# Patient Record
Sex: Male | Born: 1970 | Race: Black or African American | Hispanic: No | Marital: Married | Smoking: Never smoker
Health system: Southern US, Community
[De-identification: ages and names within clinical notes are randomized; demographics above are authoritative.]

## PROBLEM LIST (undated history)

## (undated) DIAGNOSIS — I4891 Unspecified atrial fibrillation: Secondary | ICD-10-CM

## (undated) DIAGNOSIS — R5383 Other fatigue: Secondary | ICD-10-CM

## (undated) DIAGNOSIS — I480 Paroxysmal atrial fibrillation: Secondary | ICD-10-CM

## (undated) DIAGNOSIS — E7800 Pure hypercholesterolemia, unspecified: Secondary | ICD-10-CM

## (undated) DIAGNOSIS — R079 Chest pain, unspecified: Secondary | ICD-10-CM

## (undated) DIAGNOSIS — R0602 Shortness of breath: Secondary | ICD-10-CM

## (undated) HISTORY — DX: Chest pain, unspecified: R07.9

## (undated) HISTORY — DX: Other fatigue: R53.83

## (undated) HISTORY — DX: Pure hypercholesterolemia, unspecified: E78.00

## (undated) HISTORY — DX: Paroxysmal atrial fibrillation: I48.0

## (undated) HISTORY — DX: Shortness of breath: R06.02

---

## 2012-03-07 ENCOUNTER — Encounter (HOSPITAL_BASED_OUTPATIENT_CLINIC_OR_DEPARTMENT_OTHER): Payer: Self-pay | Admitting: *Deleted

## 2012-03-07 ENCOUNTER — Emergency Department (HOSPITAL_BASED_OUTPATIENT_CLINIC_OR_DEPARTMENT_OTHER): Payer: 59

## 2012-03-07 ENCOUNTER — Emergency Department (HOSPITAL_BASED_OUTPATIENT_CLINIC_OR_DEPARTMENT_OTHER)
Admission: EM | Admit: 2012-03-07 | Discharge: 2012-03-07 | Disposition: A | Discharge status: Home / Self Care | Payer: 59 | Attending: Emergency Medicine | Admitting: Emergency Medicine

## 2012-03-07 DIAGNOSIS — Z7982 Long term (current) use of aspirin: Secondary | ICD-10-CM | POA: Insufficient documentation

## 2012-03-07 DIAGNOSIS — I4891 Unspecified atrial fibrillation: Secondary | ICD-10-CM | POA: Insufficient documentation

## 2012-03-07 DIAGNOSIS — R0602 Shortness of breath: Secondary | ICD-10-CM | POA: Insufficient documentation

## 2012-03-07 HISTORY — DX: Unspecified atrial fibrillation: I48.91

## 2012-03-07 LAB — DIFFERENTIAL
Lymphocytes Relative: 41 % (ref 12–46)
Lymphs Abs: 1.9 10*3/uL (ref 0.7–4.0)
Monocytes Absolute: 0.4 10*3/uL (ref 0.1–1.0)
Monocytes Relative: 9 % (ref 3–12)
Neutro Abs: 2.1 10*3/uL (ref 1.7–7.7)

## 2012-03-07 LAB — COMPREHENSIVE METABOLIC PANEL
BUN: 18 mg/dL (ref 6–23)
CO2: 27 mEq/L (ref 19–32)
Chloride: 103 mEq/L (ref 96–112)
Creatinine, Ser: 1.1 mg/dL (ref 0.50–1.35)
GFR calc non Af Amer: 82 mL/min — ABNORMAL LOW (ref >90)
Glucose, Bld: 94 mg/dL (ref 70–99)
Total Bilirubin: 0.6 mg/dL (ref 0.3–1.2)

## 2012-03-07 LAB — TROPONIN I: Troponin I: <0.3 ng/mL (ref <0.30)

## 2012-03-07 LAB — CBC
HCT: 46 % (ref 39.0–52.0)
Hemoglobin: 16.9 g/dL (ref 13.0–17.0)
MCHC: 36.7 g/dL — ABNORMAL HIGH (ref 30.0–36.0)
RBC: 5.44 MIL/uL (ref 4.22–5.81)

## 2012-03-07 LAB — PROTIME-INR: Prothrombin Time: 13 seconds (ref 11.6–15.2)

## 2012-03-07 MED ORDER — DILTIAZEM HCL 100 MG IV SOLR
5.0000 mg/h | INTRAVENOUS | Status: DC
  Administered 2012-03-07: 5 mg/h via INTRAVENOUS

## 2012-03-07 MED ORDER — DILTIAZEM HCL 100 MG IV SOLR
INTRAVENOUS | Status: AC
  Filled 2012-03-07: qty 100

## 2012-03-07 MED ORDER — DILTIAZEM HCL 50 MG/10ML IV SOLN
10.0000 mg | Freq: Once | INTRAVENOUS | Status: AC
  Administered 2012-03-07: 25 mg via INTRAVENOUS
  Filled 2012-03-07: qty 2

## 2012-03-07 MED ORDER — DILTIAZEM HCL ER COATED BEADS 240 MG PO CP24: 240.0000 mg | ORAL_CAPSULE | Freq: Every day | ORAL | Status: DC

## 2012-03-07 MED ORDER — DILTIAZEM HCL 25 MG/5ML IV SOLN
INTRAVENOUS | Status: AC
  Filled 2012-03-07: qty 5

## 2012-03-07 MED ORDER — DILTIAZEM HCL ER COATED BEADS 120 MG PO CP24
240.0000 mg | ORAL_CAPSULE | Freq: Once | ORAL | Status: AC
  Administered 2012-03-07: 240 mg via ORAL
  Filled 2012-03-07: qty 2

## 2012-03-07 MED ORDER — PROCAINAMIDE HCL 100 MG/ML IJ SOLN
1000.0000 mg | Freq: Once | INTRAVENOUS | Status: AC
  Administered 2012-03-07: 1000 mg via INTRAVENOUS
  Filled 2012-03-07: qty 1

## 2012-03-07 MED ORDER — RIVAROXABAN 20 MG PO TABS: 20.0000 mg | ORAL_TABLET | ORAL | Status: DC

## 2012-03-07 NOTE — ED Notes (Signed)
Shortness of breath started last night states that he has been having some shortness of breath when walking for a while but last night the shortness of breath became worse when he lay down. Denies cough , fever, or chills, denies chest pain states last night thought it might be indigestion but this am has continued. Reports 14 yrs ago was told he had a fib and was given atenolol and told to take an aspirin a day. Has been taking a baby aspirin a day for the last month denies chest pain or radiation denies any swelling of extremeties

## 2012-03-07 NOTE — Discharge Instructions (Signed)
Atrial Fibrillation Your caregiver has diagnosed you with atrial fibrillation (AFib). The heart normally beats very regularly; AFib is a type of irregular heartbeat. The heart rate may be faster or slower than normal. This can prevent your heart from pumping as well as it should. AFib can be constant (chronic) or intermittent (paroxysmal). CAUSES  Atrial fibrillation may be caused by:  Heart disease, including heart attack, coronary artery disease, heart failure, diseases of the heart valves, and others.   Blood clot in the lungs (pulmonary embolism).   Pneumonia or other infections.   Chronic lung disease.   Thyroid disease.   Toxins. These include alcohol, some medications (such as decongestant medications or diet pills), and caffeine.  In some people, no cause for AFib can be found. This is referred to as Lone Atrial Fibrillation. SYMPTOMS   Palpitations or a fluttering in your chest.   A vague sense of chest discomfort.   Shortness of breath.   Sudden onset of lightheadedness or weakness.  Sometimes, the first sign of AFib can be a complication of the condition. This could be a stroke or heart failure. DIAGNOSIS  Your description of your condition may make your caregiver suspicious of atrial fibrillation. Your caregiver will examine your pulse to determine if fibrillation is present. An EKG (electrocardiogram) will confirm the diagnosis. Further testing may help determine what caused you to have atrial fibrillation. This may include chest x-Everest Hacking, echocardiogram, blood tests, or CT scans. PREVENTION  If you have previously had atrial fibrillation, your caregiver may advise you to avoid substances known to cause the condition (such as stimulant medications, and possibly caffeine or alcohol). You may be advised to use medications to prevent recurrence. Proper treatment of any underlying condition is important to help prevent recurrence. PROGNOSIS  Atrial fibrillation does tend to  become a chronic condition over time. It can cause significant complications (see below). Atrial fibrillation is not usually immediately life-threatening, but it can shorten your life expectancy. This seems to be worse in women. If you have lone atrial fibrillation and are under 60 years old, the risk of complications is very low, and life expectancy is not shortened. RISKS AND COMPLICATIONS  Complications of atrial fibrillation can include stroke, chest pain, and heart failure. Your caregiver will recommend treatments for the atrial fibrillation, as well as for any underlying conditions, to help minimize risk of complications. TREATMENT  Treatment for AFib is divided into several categories:  Treatment of any underlying condition.   Converting you out of AFib into a regular (sinus) rhythm.   Controlling rapid heart rate.   Prevention of blood clots and stroke.  Medications and procedures are available to convert your atrial fibrillation to sinus rhythm. However, recent studies have shown that this may not offer you any advantage, and cardiac experts are continuing research and debate on this topic. More important is controlling your rapid heartbeat. The rapid heartbeat causes more symptoms, and places strain on your heart. Your caregiver will advise you on the use of medications that can control your heart rate. Atrial fibrillation is a strong stroke risk. You can lessen this risk by taking blood thinning medications such as Coumadin (warfarin), or sometimes aspirin. These medications need close monitoring by your caregiver. Over-medication can cause bleeding. Too little medication may not protect against stroke. HOME CARE INSTRUCTIONS   If your caregiver prescribed medicine to make your heartbeat more normally, take as directed.   If blood thinners were prescribed by your caregiver, take EXACTLY as directed.     Perform blood tests EXACTLY as directed.   Quit smoking. Smoking increases your  cardiac and lung (pulmonary) risks.   DO NOT drink alcohol.   DO NOT drink caffeinated drinks (e.g. coffee, soda, chocolate, and leaf teas). You may drink decaffeinated coffee, soda or tea.   If you are overweight, you should choose a reduced calorie diet to lose weight. Please see a registered dietitian if you need more information about healthy weight loss. DO NOT USE DIET PILLS as they may aggravate heart problems.   If you have other heart problems that are causing AFib, you may need to eat a low salt, fat, and cholesterol diet. Your caregiver will tell you if this is necessary.   Exercise every day to improve your physical fitness. Stay active unless advised otherwise.   If your caregiver has given you a follow-up appointment, it is very important to keep that appointment. Not keeping the appointment could result in heart failure or stroke. If there is any problem keeping the appointment, you must call back to this facility for assistance.  SEEK MEDICAL CARE IF:  You notice a change in the rate, rhythm or strength of your heartbeat.   You develop an infection or any other change in your overall health status.  SEEK IMMEDIATE MEDICAL CARE IF:   You develop chest pain, abdominal pain, sweating, weakness or feel sick to your stomach (nausea).   You develop shortness of breath.   You develop swollen feet and ankles.   You develop dizziness, numbness, or weakness of your face or limbs, or any change in vision or speech.  MAKE SURE YOU:   Understand these instructions.   Will watch your condition.   Will get help right away if you are not doing well or get worse.  Document Released: 09/11/2005 Document Revised: 08/31/2011 Document Reviewed: 04/15/2008 [facility] Patient Information 2012 [place], [place].   Return if any chest pain.  Follow up tomorrow with cardiology as scheduled.

## 2012-03-07 NOTE — ED Provider Notes (Signed)
History     CSN: [id number]  Arrival date & time 03/07/12  1478   First MD Initiated Contact with Patient 03/07/12 [phone]      Chief Complaint  Patient presents with  . Shortness of Breath    (Consider location/radiation/quality/duration/timing/severity/associated sxs/prior treatment) HPI  Past Medical History  Diagnosis Date  . Atrial fibrillation    Patient sent here from urgent care with report that he is in new-onset atrial fibrillation. Patient states that he began having some palpitations and shortness of breath last night during the night. He denies any chest pain. He had a similar episode 14 years ago that resolved spontaneously. He denies any pain associated with this. He denies cough fever or chills. He has been taking baby aspirin every day but no other medications. He denies any lightheadedness. History reviewed. No pertinent past surgical history.  History reviewed. No pertinent family history.  History  Substance Use Topics  . Smoking status: Not on file  . Smokeless tobacco: Not on file  . Alcohol Use: No      Review of Systems  All other systems reviewed and are negative.    Allergies  Review of patient's allergies indicates no known allergies.  Home Medications   Current Outpatient Rx  Name Route Sig Dispense Refill  . ONE-DAILY MULTI VITAMINS PO TABS Oral Take 1 tablet by mouth daily.    . ASPIRIN 81 MG PO TABS Oral Take 81 mg by mouth daily. Took too in the morning      BP 128/71  Pulse 108  Resp 20  Ht 6' (1.829 m)  Wt 197 lb (89.359 kg)  BMI 26.72 kg/m2  SpO2 99%  Physical Exam  Nursing note and vitals reviewed. Constitutional: He is oriented to person, place, and time. He appears well-developed and well-nourished.  HENT:  Head: Normocephalic and atraumatic.  Right Ear: External ear normal.  Left Ear: External ear normal.  Nose: Nose normal.  Mouth/Throat: Oropharynx is clear and moist.  Eyes: Conjunctivae and EOM are normal.  Pupils are equal, round, and reactive to light.  Neck: Normal range of motion. Neck supple.  Cardiovascular: Normal heart sounds and intact distal pulses.  An irregularly irregular rhythm present. Tachycardia present.   Pulmonary/Chest: Effort normal and breath sounds normal.  Abdominal: Soft. Bowel sounds are normal.  Musculoskeletal: Normal range of motion.  Neurological: He is alert and oriented to person, place, and time. He has normal reflexes.  Skin: Skin is warm and dry.  Psychiatric: He has a normal mood and affect. His behavior is normal. Thought content normal.    ED Course  Procedures (including critical care time)  Labs Reviewed  CBC - Abnormal; Notable for the following:    MCHC 36.7 (*)     All other components within normal limits  COMPREHENSIVE METABOLIC PANEL - Abnormal; Notable for the following:    Alkaline Phosphatase 26 (*)     GFR calc non Af Amer 82 (*)     All other components within normal limits  DIFFERENTIAL  TROPONIN I  PROTIME-INR  APTT   Dg Chest Port 1 View  03/07/2012  *RADIOLOGY REPORT*  Clinical Data: Shortness of breath.  New onset of atrial fibrillation.  PORTABLE CHEST - 1 VIEW  Comparison: No priors.  Findings: Lung volumes are normal.  No consolidative airspace disease.  No pleural effusions.  No pneumothorax.  No pulmonary nodule or mass noted.  Pulmonary vasculature and the cardiomediastinal silhouette are within normal limits.  There  is expansion and lucency in the posterior aspect of the left fifth rib.  The remaining bones of the thorax are otherwise unremarkable.  IMPRESSION: 1.  No radiographic evidence of acute cardiopulmonary disease. 2.  Expansion and lucency in the posterior aspect of the left fifth rib.  While this could conceivably be a benign finding that is congenital or related to prior trauma, the possibility of an aggressive lesion such as a metastatic lesion is not excluded. Clinical correlation is recommended.  If there are any  outside prior chest radiographs for comparison would be extremely useful. In the absence of prior studies, and in the absence of a known congenital anomaly or a known history of trauma that could explain this finding, further evaluation with a contrast-enhanced CT scan of the thorax would be useful.  Original Report Authenticated By: Florencia Reasons, M.D.     No diagnosis found.   Date: 03/07/2012  Rate: 110  Rhythm: atrial fibrillation  QRS Axis: normal  Intervals: normal  ST/T Wave abnormalities: nonspecific ST changes  Conduction Disutrbances:none  Narrative Interpretation:   Old EKG Reviewed: none available   Date: 03/07/2012  Rate: 107  Rhythm:atrial fibrillation  QRS Axis: normal  Intervals: normal  ST/T Wave abnormalities: nonspecific ST changes  Conduction Disutrbances:none  Narrative Interpretation:   Old EKG Reviewed: unchanged    MDM  Patient given procainamide 1 g over one hour. Patient did not have any change in the tracing with this. Patient then received Cardizem 10 mg at with a drip at 5 mg per hour followed by a 20 mg bolus. Heart rate is currently 90 with blood pressure 110/80. Patient's care discussed with Dr. Shirlee Latch he asked that patient be started on diltiazem 250 mg extended release and  xarelto 20 mg/day.  And he states that he will make an appointment for patient to be seen tomorrow and feels that he is stable for discharge given normal labs and stable hemodynamic status. Patient scheduled for follow up tomorrow at 2 p.m.        Hilario Quarry, MD 03/07/12 1500

## 2012-03-08 ENCOUNTER — Encounter: Payer: Self-pay | Admitting: Cardiology

## 2012-03-08 ENCOUNTER — Ambulatory Visit (INDEPENDENT_AMBULATORY_CARE_PROVIDER_SITE_OTHER): Payer: 59 | Admitting: Cardiology

## 2012-03-08 VITALS — BP 128/71 | HR 80 | Ht 72.0 in | Wt 197.0 lb

## 2012-03-08 DIAGNOSIS — I4891 Unspecified atrial fibrillation: Secondary | ICD-10-CM

## 2012-03-08 DIAGNOSIS — I480 Paroxysmal atrial fibrillation: Secondary | ICD-10-CM

## 2012-03-08 DIAGNOSIS — R0989 Other specified symptoms and signs involving the circulatory and respiratory systems: Secondary | ICD-10-CM

## 2012-03-08 DIAGNOSIS — R0600 Dyspnea, unspecified: Secondary | ICD-10-CM

## 2012-03-08 MED ORDER — DILTIAZEM HCL ER COATED BEADS 240 MG PO CP24: 240.0000 mg | ORAL_CAPSULE | Freq: Every day | ORAL | Status: DC

## 2012-03-08 MED ORDER — RIVAROXABAN 20 MG PO TABS: 20.0000 mg | ORAL_TABLET | Freq: Every day | ORAL | Status: DC

## 2012-03-08 NOTE — Patient Instructions (Addendum)
Rx's for Xarelto and Cartia have been sent to your pharmacy  Stop your ASA after two days of Xarelto  Your physician has requested that you have an echocardiogram. Echocardiography is a painless test that uses sound waves to create images of your heart. It provides your doctor with information about the size and shape of your heart and how well your heart's chambers and valves are working. This procedure takes approximately one hour. There are no restrictions for this procedure.   Your physician has requested that you have en exercise stress myoview. For further information please visit [email or web address] Please follow instruction sheet, as given. FASTING LABS DAY OF STRESS TEST   Your physician recommends that you schedule a follow-up appointment in: 1 month  USE ONLY TYLENOL FOR FEVER/PAIN----NO ADVIL/IBUPROFEN

## 2012-03-08 NOTE — Progress Notes (Signed)
Reason for Consult: Atrial fibrillation Referring Physician: [facility] [place] [facility] emergency room  HENCE Colton Hodges is an 41 y.o. male.  HPI: This pleasant 41 year old African American gentleman is seen for evaluation of arrhythmia.  He was seen yesterday afternoon at [facility] [place] complaining of shortness of breath and an irregular pulse.  He was found to be in atrial fibrillation.  This rhythm had occurred once previously about 20 years ago but had not recurred.  In the emergency room he was evaluated and was encouraged to be admitted for further observation but the patient declined admission but did state that he would come to our office today for followup.  In the emergency room his atrial fibrillation was documented at a rate of 110.  Emergency room he was initially treated with 1 g of procainamide over an hour but there was no change in this tracing.  He then was placed on a Cardizem 10 mg drip but remained in atrial flutter.  His care was discussed with the on-call cardiologist who recommended that the patient be started on Xarelto and diltiazem extended release.  After the patient got home last night he felt that his heart had gone back into normal rhythm.  Past Medical History  Diagnosis Date  . Atrial fibrillation   . Atrial fibrillation     No past surgical history on file.  No family history on file.  Social History:  reports that he has never smoked. He does not have any smokeless tobacco history on file. He reports that he does not drink alcohol or use illicit drugs.  Allergies: No Known Allergies  Medications: I have reviewed the patient's current medications.  Results for orders placed during the hospital encounter of 03/07/12 (from the past 48 hour(s))  CBC     Status: Abnormal   Collection Time   03/07/12  9:38 AM      Component Value Range Comment   WBC 4.6  4.0 - 10.5 K/uL    RBC 5.44  4.22 - 5.81 MIL/uL    Hemoglobin 16.9  13.0 - 17.0 g/dL    HCT 16.1   09.6 - 04.5 %    MCV 84.6  78.0 - 100.0 fL    MCH 31.1  26.0 - 34.0 pg    MCHC 36.7 (*) 30.0 - 36.0 g/dL    RDW 40.9  81.1 - 91.4 %    Platelets 195  150 - 400 K/uL   DIFFERENTIAL     Status: Normal   Collection Time   03/07/12  9:38 AM      Component Value Range Comment   Neutrophils Relative 45  43 - 77 %    Neutro Abs 2.1  1.7 - 7.7 K/uL    Lymphocytes Relative 41  12 - 46 %    Lymphs Abs 1.9  0.7 - 4.0 K/uL    Monocytes Relative 9  3 - 12 %    Monocytes Absolute 0.4  0.1 - 1.0 K/uL    Eosinophils Relative 5  0 - 5 %    Eosinophils Absolute 0.2  0.0 - 0.7 K/uL    Basophils Relative 1  0 - 1 %    Basophils Absolute 0.0  0.0 - 0.1 K/uL   COMPREHENSIVE METABOLIC PANEL     Status: Abnormal   Collection Time   03/07/12  9:38 AM      Component Value Range Comment   Sodium 140  135 - 145 mEq/L  Potassium 3.9  3.5 - 5.1 mEq/L    Chloride 103  96 - 112 mEq/L    CO2 27  19 - 32 mEq/L    Glucose, Bld 94  70 - 99 mg/dL    BUN 18  6 - 23 mg/dL    Creatinine, Ser 7.82  0.50 - 1.35 mg/dL    Calcium 9.2  8.4 - 95.6 mg/dL    Total Protein 7.2  6.0 - 8.3 g/dL    Albumin 4.3  3.5 - 5.2 g/dL    AST 26  0 - 37 U/L    ALT 30  0 - 53 U/L    Alkaline Phosphatase 26 (*) 39 - 117 U/L    Total Bilirubin 0.6  0.3 - 1.2 mg/dL    GFR calc non Af Amer 82 (*) >90 mL/min    GFR calc Af Amer >90  >90 mL/min   TROPONIN I     Status: Normal   Collection Time   03/07/12  9:38 AM      Component Value Range Comment   Troponin I <0.30  <0.30 ng/mL   PROTIME-INR     Status: Normal   Collection Time   03/07/12  9:38 AM      Component Value Range Comment   Prothrombin Time 13.0  11.6 - 15.2 seconds    INR 0.96  0.00 - 1.49   APTT     Status: Normal   Collection Time   03/07/12  9:38 AM      Component Value Range Comment   aPTT 31  24 - 37 seconds     Dg Chest Port 1 View  03/07/2012  *RADIOLOGY REPORT*  Clinical Data: Shortness of breath.  New onset of atrial fibrillation.  PORTABLE CHEST - 1 VIEW   Comparison: No priors.  Findings: Lung volumes are normal.  No consolidative airspace disease.  No pleural effusions.  No pneumothorax.  No pulmonary nodule or mass noted.  Pulmonary vasculature and the cardiomediastinal silhouette are within normal limits.  There is expansion and lucency in the posterior aspect of the left fifth rib.  The remaining bones of the thorax are otherwise unremarkable.  IMPRESSION: 1.  No radiographic evidence of acute cardiopulmonary disease. 2.  Expansion and lucency in the posterior aspect of the left fifth rib.  While this could conceivably be a benign finding that is congenital or related to prior trauma, the possibility of an aggressive lesion such as a metastatic lesion is not excluded. Clinical correlation is recommended.  If there are any outside prior chest radiographs for comparison would be extremely useful. In the absence of prior studies, and in the absence of a known congenital anomaly or a known history of trauma that could explain this finding, further evaluation with a contrast-enhanced CT scan of the thorax would be useful.  Original Report Authenticated By: Florencia Reasons, M.D.   Review of systems reveals that the patient has not been experiencing any chest pain but has noted shortness of breath.  He is a nonsmoker.  He states that his cholesterol in the past has been slightly elevated.  He states that 7 years ago he had a stress test which was normal and a sleep apnea test which was normal.  The patient does not have any history of diabetes.  Family history reveals that his mother had coronary bypass graft surgery.  His father died of a heart attack and was diabetic.  He has a sister and also  a nephew with an ICD. Social history reveals that he is married.  He has 2 boys aged 19 and 43.  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  It is not a physically stressful job.  He does not get any regular aerobic exercise. Blood pressure 128/71, pulse 80, height  6' (1.829 m), weight 89.359 kg (197 lb). The patient appears to be in no distress.  Head and neck exam reveals that the pupils are equal and reactive.  The extraocular movements are full.  There is no scleral icterus.  Mouth and pharynx are benign.  No lymphadenopathy.  No carotid bruits.  The jugular venous pressure is normal.  Thyroid is not enlarged or tender.  Chest is clear to percussion and auscultation.  No rales or rhonchi.  Expansion of the chest is symmetrical.  Heart reveals no abnormal lift or heave.  First and second heart sounds are normal.  There is no murmur gallop rub or click.  The abdomen is soft and nontender.  Bowel sounds are normoactive.  There is no hepatosplenomegaly or mass.  There are no abdominal bruits.  Extremities reveal no phlebitis or edema.  Pedal pulses are good.  There is no cyanosis or clubbing.  Neurologic exam is normal strength and no lateralizing weakness.  No sensory deficits.  Integument reveals no rash    EKG today shows that he has gone back into normal sinus rhythm.  The tracing is now normal at rest  Assessment/Plan: Paroxysmal atrial fibrillation associated with dyspnea and fatigue. Risk factors for premature coronary disease include defibrillators in a sister and a nephew, and a mother who had bypass graft surgery and a father who died of a heart attack.  The patient himself has had slightly elevated cholesterol.  He does not know of any history of high blood pressure  Plan will be for him to return for a two-dimensional echocardiogram.  We will also get fasting lipid panel nasal metabolic panel and hepatic function panel seen.  We will have him return for a stress Myoview stress test.  At the present time he will continue on his present medications which are 0 relative 20 mg daily and Cartia XT 240 mg daily.  The duration of treatment with Xarelto will depend in part on what the results of these studies are.  Cassell Clement 03/08/2012, 6:07  PM

## 2012-03-18 ENCOUNTER — Other Ambulatory Visit: Payer: 59

## 2012-03-21 ENCOUNTER — Encounter (HOSPITAL_COMMUNITY): Payer: Self-pay

## 2012-03-21 ENCOUNTER — Ambulatory Visit (INDEPENDENT_AMBULATORY_CARE_PROVIDER_SITE_OTHER): Payer: 59 | Admitting: *Deleted

## 2012-03-21 ENCOUNTER — Ambulatory Visit (HOSPITAL_COMMUNITY): Payer: 59 | Attending: Cardiology | Admitting: Radiology

## 2012-03-21 VITALS — BP 123/83 | Ht 72.0 in | Wt 200.0 lb

## 2012-03-21 DIAGNOSIS — R0989 Other specified symptoms and signs involving the circulatory and respiratory systems: Secondary | ICD-10-CM | POA: Insufficient documentation

## 2012-03-21 DIAGNOSIS — R002 Palpitations: Secondary | ICD-10-CM | POA: Insufficient documentation

## 2012-03-21 DIAGNOSIS — R0609 Other forms of dyspnea: Secondary | ICD-10-CM | POA: Insufficient documentation

## 2012-03-21 DIAGNOSIS — I4891 Unspecified atrial fibrillation: Secondary | ICD-10-CM

## 2012-03-21 DIAGNOSIS — I480 Paroxysmal atrial fibrillation: Secondary | ICD-10-CM

## 2012-03-21 DIAGNOSIS — E785 Hyperlipidemia, unspecified: Secondary | ICD-10-CM | POA: Insufficient documentation

## 2012-03-21 DIAGNOSIS — Z8249 Family history of ischemic heart disease and other diseases of the circulatory system: Secondary | ICD-10-CM | POA: Insufficient documentation

## 2012-03-21 DIAGNOSIS — R0602 Shortness of breath: Secondary | ICD-10-CM | POA: Insufficient documentation

## 2012-03-21 DIAGNOSIS — R9431 Abnormal electrocardiogram [ECG] [EKG]: Secondary | ICD-10-CM

## 2012-03-21 DIAGNOSIS — R0600 Dyspnea, unspecified: Secondary | ICD-10-CM

## 2012-03-21 LAB — BASIC METABOLIC PANEL
BUN: 17 mg/dL (ref 6–23)
CO2: 28 mEq/L (ref 19–32)
Calcium: 9.3 mg/dL (ref 8.4–10.5)
Chloride: 106 mEq/L (ref 96–112)
Creatinine, Ser: 1.2 mg/dL (ref 0.4–1.5)
Glucose, Bld: 98 mg/dL (ref 70–99)

## 2012-03-21 LAB — HEPATIC FUNCTION PANEL
ALT: 21 U/L (ref 0–53)
AST: 26 U/L (ref 0–37)
Albumin: 4.4 g/dL (ref 3.5–5.2)
Total Bilirubin: 0.6 mg/dL (ref 0.3–1.2)
Total Protein: 7.1 g/dL (ref 6.0–8.3)

## 2012-03-21 LAB — LIPID PANEL
Cholesterol: 217 mg/dL — ABNORMAL HIGH (ref 0–200)
Total CHOL/HDL Ratio: 5
Triglycerides: 63 mg/dL (ref 0.0–149.0)

## 2012-03-21 MED ORDER — TECHNETIUM TC 99M TETROFOSMIN IV KIT
10.0000 | PACK | Freq: Once | INTRAVENOUS | Status: AC | PRN
  Administered 2012-03-21: 10 via INTRAVENOUS

## 2012-03-21 MED ORDER — TECHNETIUM TC 99M TETROFOSMIN IV KIT
30.0000 | PACK | Freq: Once | INTRAVENOUS | Status: AC | PRN
  Administered 2012-03-21: 30 via INTRAVENOUS

## 2012-03-21 NOTE — Progress Notes (Signed)
Patient ID: Colton Hodges, male   DOB: August 24, 1971, 41 y.o.   MRN: [id number] On 03/07/12 ED visit in [place]. Patient saw Dr. Patty Sermons for office visit on 03/08/12, back in NSR on EKG. Patient given new medication for diltiazem and rivaroxaban. Patient here for stress myoview today. He hasn't started diltiazem and rivaroxaban. HR 60 with regular rhythm, O2 sat 97%RA. Okay to do test if in NSR per Dr. Patty Sermons. Will discuss medication with patient after myoview results per Dr. Patty Sermons.Irean Hong, RN

## 2012-03-21 NOTE — Progress Notes (Signed)
[facility] SITE 3 NUCLEAR MED [address] [place] [address] [phone]  Cardiology Nuclear Med Study  Colton Hodges is a 41 y.o. male     MRN : [id number]     DOB: 1970/11/10  Procedure Date: 03/21/2012  Nuclear Med Background Indication for Stress Test:  Evaluation for Ischemia, and Patient seen in hospital on 03/07/12 for AF rate 110, enzymes negative. Follow-up office visit on 03/08/12 with Dr. Patty Sermons, back in NSR on EKG History:  AFIB, GXT: 7 yrs ago, 03/25/12: ECHO Cardiac Risk Factors: Family History - CAD and Lipids  Symptoms:  DOE, Palpitations and SOB   Nuclear Pre-Procedure Caffeine/Decaff Intake:  None > 12 hrs NPO After: 8:00pm   Lungs:  clear O2 Sat: 96% on room air. IV 0.9% NS with Angio Cath:  20g  IV Site: R Forearm x 1, tolerated well IV Started by:  Irean Hong, RN  Chest Size (in):  46 Cup Size: n/a  Height: 6' (1.829 m)  Weight:  200 lb (90.719 kg)  BMI:  Body mass index is 27.12 kg/(m^2). Tech Comments:  Patient hasn't started diltiazem and rivaroxaban. He is concerned about side effects. HR 60 with regular rhythm, O2 sat 97%RA Okay to do test if in NSR per Dr. Patty Sermons. Will discuss medication with patient after myoview results per Dr. Patty Sermons.Irean Hong, RN    Nuclear Med Study 1 or 2 day study: 1 day  Stress Test Type:  Stress  Reading MD: Marca Ancona, MD  Order Authorizing Provider:  Cassell Clement, MD  Resting Radionuclide: Technetium 22m Tetrofosmin  Resting Radionuclide Dose: 11.0 mCi   Stress Radionuclide:  Technetium 66m Tetrofosmin  Stress Radionuclide Dose: 32.0 mCi           Stress Protocol Rest HR: 61 Stress HR: 157  Rest BP: 123/83 Stress BP: 191/67  Exercise Time (min): 10:00 METS: 11.70   Predicted Max HR: 179 bpm % Max HR: 87.71 bpm Rate Pressure Product: 21308   Dose of Adenosine (mg):  n/a Dose of Lexiscan: n/a mg  Dose of Atropine (mg): n/a Dose of Dobutamine: n/a mcg/kg/min (at max HR)    Stress Test Technologist: Milana Na, EMT-P  Nuclear Technologist:  Doyne Keel, CNMT     Rest Procedure:  Myocardial perfusion imaging was performed at rest 45 minutes following the intravenous administration of Technetium 73m Tetrofosmin. Rest ECG: NSR with non-specific ST-T wave changes  Stress Procedure:  The patient performed treadmill exercise using a Bruce  Protocol for 10:00 minutes. The patient stopped due to fatigue and denied any chest pain.  There were no significant ST-T wave changes.  Technetium 62m Tetrofosmin was injected at peak exercise and myocardial perfusion imaging was performed after a brief delay. Stress ECG: No significant change from baseline ECG  QPS Raw Data Images:  Normal; no motion artifact; normal heart/lung ratio. Stress Images:  Normal homogeneous uptake in all areas of the myocardium. Rest Images:  Normal homogeneous uptake in all areas of the myocardium. Subtraction (SDS):  There is no evidence of scar or ischemia. Transient Ischemic Dilatation (Normal <1.22):  0.93 Lung/Heart Ratio (Normal <0.45):  0.31  Quantitative Gated Spect Images QGS EDV:  109 ml QGS ESV:  53 ml  Impression Exercise Capacity:  Good exercise capacity. BP Response:  Normal blood pressure response. Clinical Symptoms:  Fatigue, no chest pain.  ECG Impression:  No significant ST segment change suggestive of ischemia. Comparison with Prior Nuclear Study: No images to compare  Overall Impression:  Normal stress nuclear study.  LV Ejection Fraction: 51%.  LV Wall Motion:  Low normal to mildly reduced LV systolic function; NL Wall Motion.  Would consider confirming EF with echo.   Colton Hodges 03/21/2012

## 2012-03-25 ENCOUNTER — Other Ambulatory Visit (HOSPITAL_COMMUNITY): Payer: 59

## 2012-03-26 ENCOUNTER — Telehealth: Payer: Self-pay | Admitting: Cardiology

## 2012-03-26 NOTE — Telephone Encounter (Signed)
Follow-up:    Patient returned your call.  Please call back. 

## 2012-03-26 NOTE — Telephone Encounter (Signed)
Left message to call back  

## 2012-03-26 NOTE — Telephone Encounter (Signed)
Message copied by Burnell Blanks on Tue Mar 26, 2012  3:20 PM ------      Message from: Cassell Clement      Created: Sun Mar 24, 2012  8:00 PM       The nuclear showed no ischemia but his EF is low normal 51%.  He has a hx of PAF.  I would like him to return for a 2D echo to assess his LV function further.

## 2012-03-26 NOTE — Telephone Encounter (Signed)
Fu call °Patient returning your call °

## 2012-03-26 NOTE — Telephone Encounter (Signed)
Advised of echo and labs.  Patient very busy during month of July and would like to put echo off until August.  Is scheduled for 1 month follow up with  Dr. Patty Sermons on 7/24 and would like to have both same day if possible. Will forward to  Dr. Patty Sermons to see if ok to wait until August

## 2012-03-26 NOTE — Telephone Encounter (Signed)
Message copied by Burnell Blanks on Tue Mar 26, 2012  3:26 PM ------      Message from: Cassell Clement      Created: Sun Mar 24, 2012  7:54 PM       Please report.  LDL level is too high. Needs to be on a careful low cholesterol diet. Recheck LP in 3 month.  If still high will need statin. Liver and kidney okay.

## 2012-03-26 NOTE — Telephone Encounter (Signed)
Okay to wait until august

## 2012-03-27 NOTE — Telephone Encounter (Signed)
Left message to call back  

## 2012-04-17 ENCOUNTER — Ambulatory Visit: Payer: 59 | Admitting: Cardiology

## 2012-04-26 ENCOUNTER — Other Ambulatory Visit (HOSPITAL_COMMUNITY): Payer: 59

## 2012-05-02 ENCOUNTER — Encounter: Payer: Self-pay | Admitting: *Deleted

## 2012-05-03 ENCOUNTER — Ambulatory Visit (HOSPITAL_COMMUNITY): Payer: 59 | Attending: Cardiology | Admitting: Radiology

## 2012-05-03 ENCOUNTER — Ambulatory Visit (INDEPENDENT_AMBULATORY_CARE_PROVIDER_SITE_OTHER): Payer: 59 | Admitting: Cardiology

## 2012-05-03 ENCOUNTER — Encounter: Payer: Self-pay | Admitting: Cardiology

## 2012-05-03 ENCOUNTER — Other Ambulatory Visit: Payer: Self-pay

## 2012-05-03 VITALS — BP 120/80 | HR 60 | Ht 71.5 in | Wt 199.0 lb

## 2012-05-03 DIAGNOSIS — Z8249 Family history of ischemic heart disease and other diseases of the circulatory system: Secondary | ICD-10-CM | POA: Insufficient documentation

## 2012-05-03 DIAGNOSIS — I4891 Unspecified atrial fibrillation: Secondary | ICD-10-CM

## 2012-05-03 DIAGNOSIS — R9430 Abnormal result of cardiovascular function study, unspecified: Secondary | ICD-10-CM | POA: Insufficient documentation

## 2012-05-03 DIAGNOSIS — I480 Paroxysmal atrial fibrillation: Secondary | ICD-10-CM

## 2012-05-03 DIAGNOSIS — R0989 Other specified symptoms and signs involving the circulatory and respiratory systems: Secondary | ICD-10-CM | POA: Insufficient documentation

## 2012-05-03 DIAGNOSIS — R0600 Dyspnea, unspecified: Secondary | ICD-10-CM

## 2012-05-03 DIAGNOSIS — R0609 Other forms of dyspnea: Secondary | ICD-10-CM | POA: Insufficient documentation

## 2012-05-03 DIAGNOSIS — E7800 Pure hypercholesterolemia, unspecified: Secondary | ICD-10-CM | POA: Insufficient documentation

## 2012-05-03 DIAGNOSIS — I059 Rheumatic mitral valve disease, unspecified: Secondary | ICD-10-CM | POA: Insufficient documentation

## 2012-05-03 NOTE — Progress Notes (Signed)
Echocardiogram performed.  

## 2012-05-03 NOTE — Assessment & Plan Note (Signed)
The patient has had no recurrent atrial fibrillation.  He is having no chest pain or shortness of breath.  His energy level has been good.

## 2012-05-03 NOTE — Progress Notes (Signed)
Colton Hodges Date of Birth:  June 17, 1971 [facility] [address] [place], [place]  [address] [phone]  Fax   [phone]  HPI: This pleasant 41 year old African American male is seen for a scheduled followup office visit.  He has a past history of paroxysmal atrial fibrillation.  On the evening of 03/07/10 he had rapid irregular pulse and was seen at [facility] [place] and was found to be in atrial fibrillation with a rapid ventricular response.  He had a similar episode about 20 years earlier.  In the emergency room he had a documented ventricular rate of 110.  He refused hospitalization and was sent home on Xarelto and diltiazem and converted at home later that evening.  He has had no recurrences.  At the present time he is on just a baby aspirin and a multivitamin.  He previously had been prescribed Xarelto and diltiazem which he never had filled.  He's been feeling well.  He returned to the office today and had his previously scheduled echocardiogram and it showed normal left ventricular function with an ejection fraction of 50-55% and no regional wall motion abnormalities and his mitral valve showed mild mitral regurgitation.  Current Outpatient Prescriptions  Medication Sig Dispense Refill  . aspirin 81 MG tablet Take 81 mg by mouth daily.      . Multiple Vitamin (MULTIVITAMIN) tablet Take 1 tablet by mouth daily.      Marland Kitchen diltiazem (CARTIA XT) 240 MG 24 hr capsule Take 1 capsule (240 mg total) by mouth daily.  30 capsule  5  . Rivaroxaban 20 MG TABS Take 20 mg by mouth daily.  30 tablet  5    No Known Allergies  Patient Active Problem List  Diagnosis  . Atrial fibrillation  . PAF (paroxysmal atrial fibrillation)  . Pure hypercholesterolemia    History  Smoking status  . Never Smoker   Smokeless tobacco  . Not on file    History  Alcohol Use No    Family History  Problem Relation Age of Onset  . Coronary artery disease Mother     with  CABG  . Heart attack Father   . Diabetes Father     with an ICD  . Heart disease Sister   . Heart disease Other     nephew -- with an ICD    Review of Systems: The patient denies any heat or cold intolerance.  No weight gain or weight loss.  The patient denies headaches or blurry vision.  There is no cough or sputum production.  The patient denies dizziness.  There is no hematuria or hematochezia.  The patient denies any muscle aches or arthritis.  The patient denies any rash.  The patient denies frequent falling or instability.  There is no history of depression or anxiety.  All other systems were reviewed and are negative.   Physical Exam: Filed Vitals:   05/03/12 1649  BP: 120/80  Pulse: 60   the general appearance reveals a well-developed well-nourished gentleman in no distress.The head and neck exam reveals pupils equal and reactive.  Extraocular movements are full.  There is no scleral icterus.  The mouth and pharynx are normal.  The neck is supple.  The carotids reveal no bruits.  The jugular venous pressure is normal.  The  thyroid is not enlarged.  There is no lymphadenopathy.  The chest is clear to percussion and auscultation.  There are no rales or rhonchi.  Expansion of the  chest is symmetrical.  The precordium is quiet.  The first heart sound is normal.  The second heart sound is physiologically split.  There is no murmur gallop rub or click.  There is no abnormal lift or heave.  The abdomen is soft and nontender.  The bowel sounds are normal.  The liver and spleen are not enlarged.  There are no abdominal masses.  There are no abdominal bruits.  Extremities reveal good pedal pulses.  There is no phlebitis or edema.  There is no cyanosis or clubbing.  Strength is normal and symmetrical in all extremities.  There is no lateralizing weakness.  There are no sensory deficits.  The skin is warm and dry.  There is no rash.      Assessment / Plan: The patient has had no recurrence of  his paroxysmal atrial fibrillation.  He is CHADSS  0. He'll continue taking a baby aspirin daily.  Continue on low cholesterol heart healthy diet.  Avoid excessive caffeine.  Recheck when necessary.

## 2012-05-03 NOTE — Assessment & Plan Note (Signed)
Patient has a past history of hypercholesterolemia.  He is now on a low cholesterol diet.  He is not on any cholesterol-lowering agents at this time and he is not interested in taking any medication

## 2012-05-03 NOTE — Patient Instructions (Signed)
Your physician recommends that you continue on your current medications as directed. Please refer to the Current Medication list given to you today.  Continue low cholesterol diet  Follow up as needed

## 2012-06-25 ENCOUNTER — Other Ambulatory Visit: Payer: Self-pay | Admitting: *Deleted

## 2012-06-25 DIAGNOSIS — E7800 Pure hypercholesterolemia, unspecified: Secondary | ICD-10-CM

## 2012-06-25 DIAGNOSIS — I4891 Unspecified atrial fibrillation: Secondary | ICD-10-CM

## 2012-06-25 DIAGNOSIS — I480 Paroxysmal atrial fibrillation: Secondary | ICD-10-CM

## 2012-06-26 ENCOUNTER — Other Ambulatory Visit: Payer: 59

## 2012-07-08 ENCOUNTER — Other Ambulatory Visit: Payer: 59

## 2012-07-15 ENCOUNTER — Other Ambulatory Visit: Payer: 59

## 2013-05-20 IMAGING — CR DG CHEST 1V PORT
1 series · 1 of 1 positions shown · non-contrast
Comparison: No priors.

CLINICAL DATA: Shortness of breath.  New onset of atrial
fibrillation.

PORTABLE CHEST - 1 VIEW

[view not recorded]
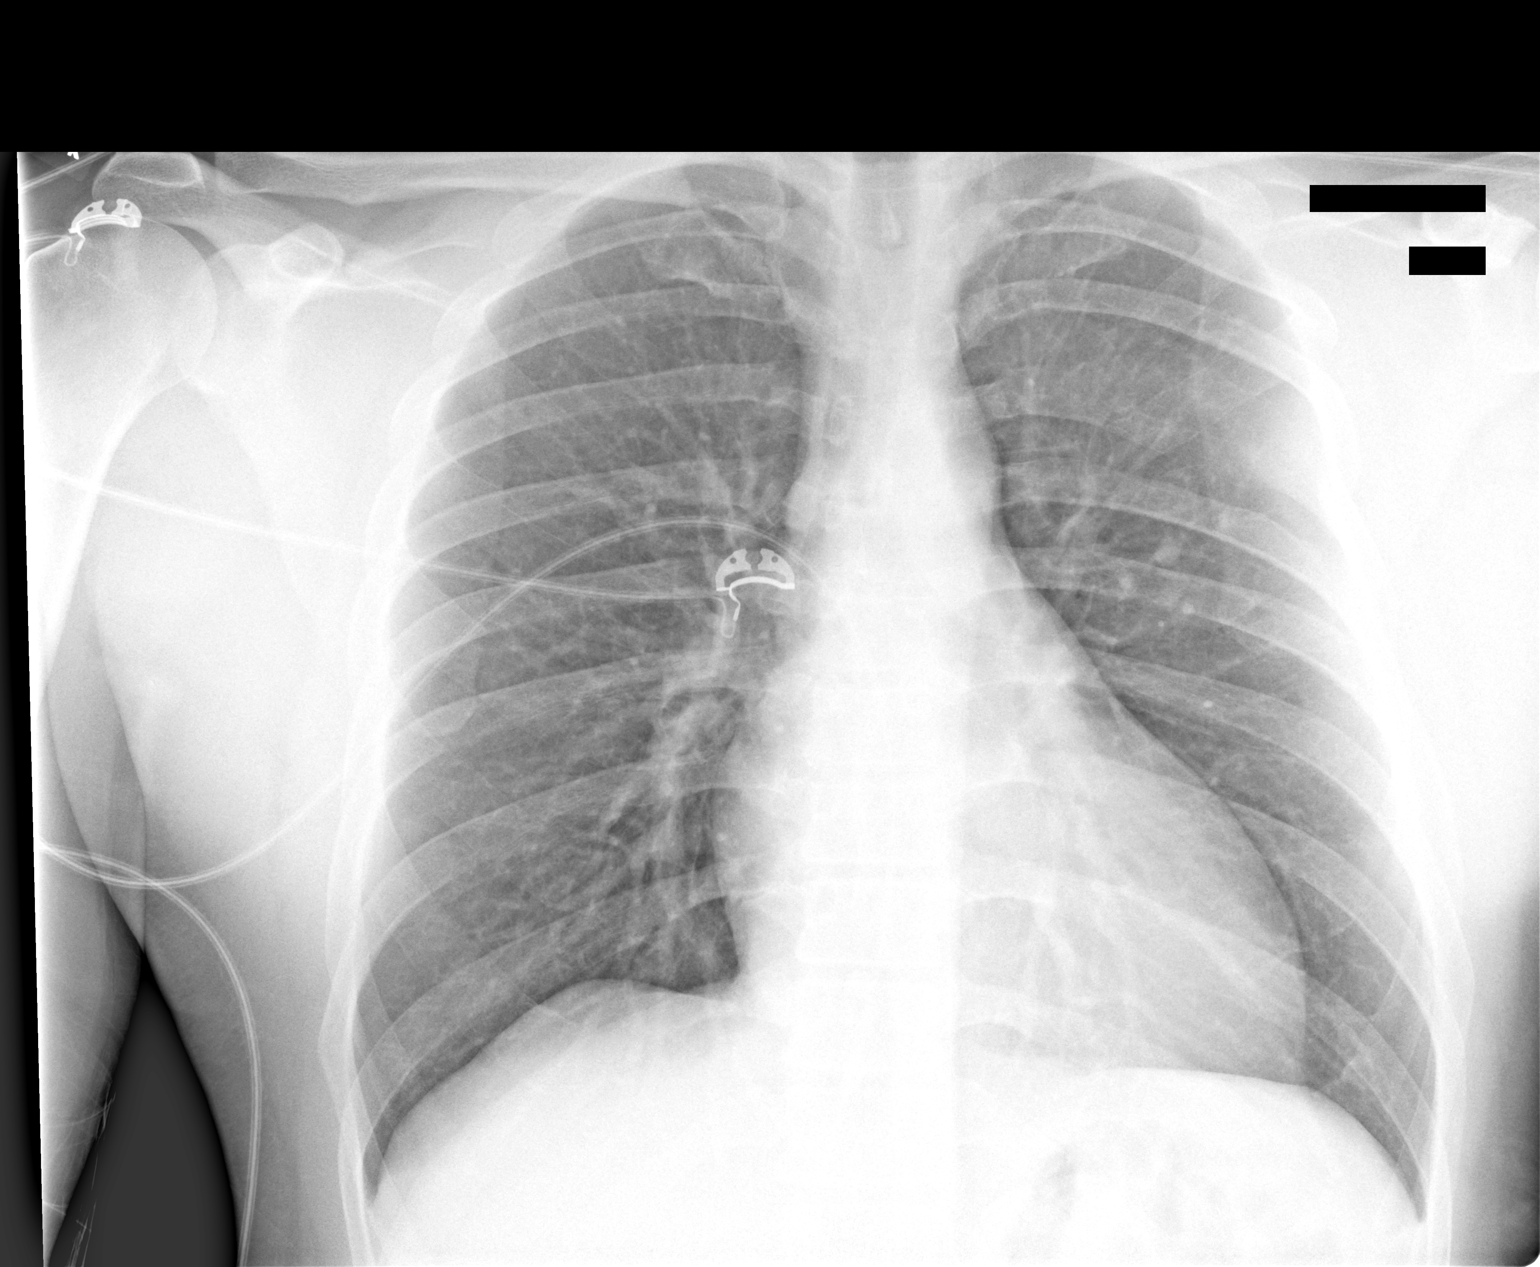

[1 of 1 positions shown; findings below may reference images not displayed]

FINDINGS: Lung volumes are normal.  No consolidative airspace
disease.  No pleural effusions.  No pneumothorax.  No pulmonary
nodule or mass noted.  Pulmonary vasculature and the
cardiomediastinal silhouette are within normal limits.  There is
expansion and lucency in the posterior aspect of the left fifth
rib.  The remaining bones of the thorax are otherwise unremarkable.
IMPRESSION: 1.  No radiographic evidence of acute cardiopulmonary disease.
2.  Expansion and lucency in the posterior aspect of the left fifth
rib.  While this could conceivably be a benign finding that is
congenital or related to prior trauma, the possibility of an
aggressive lesion such as a metastatic lesion is not excluded.
Clinical correlation is recommended.  If there are any outside
prior chest radiographs for comparison would be extremely useful.
In the absence of prior studies, and in the absence of a known
congenital anomaly or a known history of trauma that could explain
this finding, further evaluation with a contrast-enhanced CT scan
of the thorax would be useful.

## 2018-03-12 DIAGNOSIS — H40013 Open angle with borderline findings, low risk, bilateral: Secondary | ICD-10-CM | POA: Diagnosis not present

## 2018-10-09 DIAGNOSIS — J069 Acute upper respiratory infection, unspecified: Secondary | ICD-10-CM | POA: Diagnosis not present

## 2018-12-09 DIAGNOSIS — M549 Dorsalgia, unspecified: Secondary | ICD-10-CM | POA: Diagnosis not present

## 2021-03-29 ENCOUNTER — Ambulatory Visit: Payer: 59 | Admitting: Podiatry

## 2021-03-29 ENCOUNTER — Ambulatory Visit (INDEPENDENT_AMBULATORY_CARE_PROVIDER_SITE_OTHER): Payer: 59

## 2021-03-29 ENCOUNTER — Other Ambulatory Visit: Payer: Self-pay

## 2021-03-29 ENCOUNTER — Encounter: Payer: Self-pay | Admitting: Podiatry

## 2021-03-29 DIAGNOSIS — M722 Plantar fascial fibromatosis: Secondary | ICD-10-CM | POA: Diagnosis not present

## 2021-03-29 MED ORDER — METHYLPREDNISOLONE 4 MG PO TBPK: ORAL_TABLET | ORAL | 0 refills | Status: DC

## 2021-03-29 MED ORDER — TRIAMCINOLONE ACETONIDE 40 MG/ML IJ SUSP
20.0000 mg | Freq: Once | INTRAMUSCULAR | Status: AC
  Administered 2021-03-29: 20 mg

## 2021-03-29 MED ORDER — MELOXICAM 15 MG PO TABS: 15.0000 mg | ORAL_TABLET | Freq: Every day | ORAL | 3 refills | Status: AC

## 2021-03-29 NOTE — Patient Instructions (Signed)

## 2021-03-30 NOTE — Progress Notes (Signed)
  Subjective:  Patient ID: Colton Hodges, male    DOB: 04-12-1971,  MRN: [id number] HPI Chief Complaint  Patient presents with   Foot Pain    Plantar heel left - aching x several months, history of PF, but feels different, AM pain, tried ice and Ibuprofen PRN   New Patient (Initial Visit)    50 y.o. male presents with the above complaint.   ROS: Denies fever chills nausea vomiting muscle aches pains calf pain back pain chest pain shortness of breath.  Past Medical History:  Diagnosis Date   Atrial fibrillation (HCC)    Chest pain    Elevated cholesterol    Fatigue    Paroxysmal atrial fibrillation (HCC)    SOB (shortness of breath)    No past surgical history on file.  Current Outpatient Medications:    meloxicam (MOBIC) 15 MG tablet, Take 1 tablet (15 mg total) by mouth daily., Disp: 30 tablet, Rfl: 3   methylPREDNISolone (MEDROL DOSEPAK) 4 MG TBPK tablet, 6 day dose pack - take as directed, Disp: 21 tablet, Rfl: 0   aspirin 81 MG tablet, Take 81 mg by mouth daily., Disp: , Rfl:   No Known Allergies Review of Systems Objective:  There were no vitals filed for this visit.  General: Well developed, nourished, in no acute distress, alert and oriented x3   Dermatological: Skin is warm, dry and supple bilateral. Nails x 10 are well maintained; remaining integument appears unremarkable at this time. There are no open sores, no preulcerative lesions, no rash or signs of infection present.  Vascular: Dorsalis Pedis artery and Posterior Tibial artery pedal pulses are 2/4 bilateral with immedate capillary fill time. Pedal hair growth present. No varicosities and no lower extremity edema present bilateral.   Neruologic: Grossly intact via light touch bilateral. Vibratory intact via tuning fork bilateral. Protective threshold with Semmes Wienstein monofilament intact to all pedal sites bilateral. Patellar and Achilles deep tendon reflexes 2+ bilateral. No Babinski or clonus noted  bilateral.   Musculoskeletal: No gross boney pedal deformities bilateral. No pain, crepitus, or limitation noted with foot and ankle range of motion bilateral. Muscular strength 5/5 in all groups tested bilateral.  Pain on palpation medial calcaneal tubercle at the plantar fashion calcaneal insertion site.  No pain on palpation of the Achilles.  Gait: Unassisted, Nonantalgic.    Radiographs:  Radiographs taken today demonstrate an osseously mature individual small plantar distally oriented calcaneal spur and a small posterior proximally oriented calcaneal heel spur.  Soft tissue increase in density plantar fascial cannula insertion site indicative of Planter fasciitis.  Mild pes planus is also noted.  Assessment & Plan:   Assessment: Planter fasciitis left foot.  Plan: Discussed etiology pathology conservative versus surgical therapies we injected his heel today 20 mg Kenalog 5 mg Marcaine point of maximal tenderness.  Placed him in a plantar fascial brace started him on methylprednisolone to be followed by meloxicam.  Discussed appropriate shoe gear stretching exercises ice therapy and shoe gear modifications.  I would like to follow-up with him in 1 month     Mcgregor Tinnon T. [place], [place]

## 2021-05-10 ENCOUNTER — Encounter: Payer: Self-pay | Admitting: Podiatry

## 2021-05-10 ENCOUNTER — Ambulatory Visit: Payer: 59 | Admitting: Podiatry

## 2021-05-10 ENCOUNTER — Other Ambulatory Visit: Payer: Self-pay

## 2021-05-10 DIAGNOSIS — M722 Plantar fascial fibromatosis: Secondary | ICD-10-CM

## 2021-05-10 MED ORDER — TRIAMCINOLONE ACETONIDE 40 MG/ML IJ SUSP
20.0000 mg | Freq: Once | INTRAMUSCULAR | Status: AC
  Administered 2021-05-10: 20 mg

## 2021-05-11 NOTE — Progress Notes (Signed)
He presents today for follow-up of his Planter fasciitis to his left heel.  He states that he really did well for about 2 weeks and then he walked 28,000 steps at work and it reduced down to about 50% improvement.  He states that it was as high as 85% improvement at 1 time.  States that he is no longer taking the meloxicam because it made him lightheaded.  Objective: Vital signs are stable he is alert and oriented x3 pulses is still palpable he has pain on palpation medial calcaneal tubercle of the left heel.  Assessment: Slight setback with Planter fasciitis.  Plan: At this point I reinjected the area request that he takes 7.5 mg of meloxicam by cutting his 15 mg tablets in half should this continue to cause lightheadedness he will notify us for a different medication and I would like to follow-up with him in 1 month if not improved consider MRI.

## 2021-06-21 ENCOUNTER — Ambulatory Visit: Payer: 59 | Admitting: Podiatry

## 2023-08-30 ENCOUNTER — Other Ambulatory Visit (HOSPITAL_BASED_OUTPATIENT_CLINIC_OR_DEPARTMENT_OTHER): Payer: Self-pay

## 2023-08-30 DIAGNOSIS — R0681 Apnea, not elsewhere classified: Secondary | ICD-10-CM

## 2023-08-30 DIAGNOSIS — R0683 Snoring: Secondary | ICD-10-CM
